# Patient Record
Sex: Male | Born: 1995 | Race: White | Hispanic: No | Marital: Single | Smoking: Never smoker
Health system: Southern US, Community
[De-identification: ages and names within clinical notes are randomized; demographics above are authoritative.]

## PROBLEM LIST (undated history)

## (undated) HISTORY — PX: NO PAST SURGERIES: SHX2092

---

## 2016-04-07 ENCOUNTER — Ambulatory Visit
Admission: RE | Admit: 2016-04-07 | Discharge: 2016-04-07 | Disposition: A | Discharge status: Home / Self Care | Payer: Self-pay | Source: Ambulatory Visit | Attending: Occupational Medicine | Admitting: Occupational Medicine

## 2016-04-07 ENCOUNTER — Other Ambulatory Visit: Payer: Self-pay | Admitting: Occupational Medicine

## 2016-04-07 DIAGNOSIS — Z021 Encounter for pre-employment examination: Secondary | ICD-10-CM

## 2018-03-27 ENCOUNTER — Other Ambulatory Visit: Payer: Self-pay | Admitting: Orthopaedic Surgery

## 2018-03-27 ENCOUNTER — Other Ambulatory Visit: Payer: Self-pay

## 2018-03-27 ENCOUNTER — Encounter (HOSPITAL_COMMUNITY): Payer: Self-pay | Admitting: *Deleted

## 2018-03-27 NOTE — Progress Notes (Signed)
Spoke with pt for pre-op call. Pt denies cardiac history, HTN or Diabetes. 

## 2018-03-30 ENCOUNTER — Encounter (HOSPITAL_COMMUNITY)
Admission: RE | Disposition: A | Discharge status: Home / Self Care | Payer: Self-pay | Source: Home / Self Care | Attending: Orthopaedic Surgery

## 2018-03-30 ENCOUNTER — Ambulatory Visit (HOSPITAL_COMMUNITY): Payer: 59 | Admitting: Certified Registered"

## 2018-03-30 ENCOUNTER — Other Ambulatory Visit: Payer: Self-pay

## 2018-03-30 ENCOUNTER — Encounter (HOSPITAL_COMMUNITY): Payer: Self-pay | Admitting: *Deleted

## 2018-03-30 ENCOUNTER — Ambulatory Visit (HOSPITAL_COMMUNITY): Payer: 59

## 2018-03-30 ENCOUNTER — Ambulatory Visit (HOSPITAL_COMMUNITY)
Admission: RE | Admit: 2018-03-30 | Discharge: 2018-03-30 | Disposition: A | Discharge status: Home / Self Care | Payer: 59 | Attending: Orthopaedic Surgery | Admitting: Orthopaedic Surgery

## 2018-03-30 DIAGNOSIS — S92351A Displaced fracture of fifth metatarsal bone, right foot, initial encounter for closed fracture: Secondary | ICD-10-CM | POA: Insufficient documentation

## 2018-03-30 DIAGNOSIS — X501XXA Overexertion from prolonged static or awkward postures, initial encounter: Secondary | ICD-10-CM | POA: Diagnosis not present

## 2018-03-30 DIAGNOSIS — T148XXA Other injury of unspecified body region, initial encounter: Secondary | ICD-10-CM

## 2018-03-30 HISTORY — PX: REPAIR EXTENSOR TENDON WITH METATARSAL OSTEOTOMY AND OPEN REDUCTION IN: SHX5698

## 2018-03-30 LAB — HEMOGLOBIN: Hemoglobin: 14.9 g/dL (ref 13.0–17.0)

## 2018-03-30 SURGERY — REPAIR EXTENSOR TENDON WITH METATARSAL OSTEOTOMY AND OPEN REDUCTION INTERNAL FIXATION (ORIF) METATARSAL
Anesthesia: General | Site: Foot | Laterality: Right

## 2018-03-30 MED ORDER — MEPERIDINE HCL 50 MG/ML IJ SOLN: 6.2500 mg | INTRAMUSCULAR | Status: DC | PRN

## 2018-03-30 MED ORDER — LIDOCAINE 2% (20 MG/ML) 5 ML SYRINGE
INTRAMUSCULAR | Status: DC | PRN
  Administered 2018-03-30: 40 mg via INTRAVENOUS

## 2018-03-30 MED ORDER — OXYCODONE HCL 5 MG PO TABS: 5.0000 mg | ORAL_TABLET | ORAL | 0 refills | Status: DC | PRN

## 2018-03-30 MED ORDER — EPHEDRINE 5 MG/ML INJ
INTRAVENOUS | Status: AC
  Filled 2018-03-30: qty 10

## 2018-03-30 MED ORDER — SUGAMMADEX SODIUM 200 MG/2ML IV SOLN
INTRAVENOUS | Status: DC | PRN
  Administered 2018-03-30: 200 mg via INTRAVENOUS

## 2018-03-30 MED ORDER — DEXAMETHASONE SODIUM PHOSPHATE 10 MG/ML IJ SOLN
INTRAMUSCULAR | Status: DC | PRN
  Administered 2018-03-30: 10 mg via INTRAVENOUS

## 2018-03-30 MED ORDER — ROCURONIUM BROMIDE 50 MG/5ML IV SOSY
PREFILLED_SYRINGE | INTRAVENOUS | Status: AC
  Filled 2018-03-30: qty 5

## 2018-03-30 MED ORDER — SCOPOLAMINE 1 MG/3DAYS TD PT72
MEDICATED_PATCH | TRANSDERMAL | Status: AC
  Administered 2018-03-30: 1.5 mg
  Filled 2018-03-30: qty 1

## 2018-03-30 MED ORDER — LIDOCAINE 2% (20 MG/ML) 5 ML SYRINGE
INTRAMUSCULAR | Status: AC
  Filled 2018-03-30: qty 5

## 2018-03-30 MED ORDER — ONDANSETRON HCL 4 MG/2ML IJ SOLN
INTRAMUSCULAR | Status: AC
  Filled 2018-03-30: qty 2

## 2018-03-30 MED ORDER — EPHEDRINE SULFATE 50 MG/ML IJ SOLN
INTRAMUSCULAR | Status: DC | PRN
  Administered 2018-03-30 (×2): 5 mg via INTRAVENOUS
  Administered 2018-03-30: 10 mg via INTRAVENOUS

## 2018-03-30 MED ORDER — OXYCODONE HCL 5 MG PO TABS: 5.0000 mg | ORAL_TABLET | ORAL | 0 refills | Status: AC | PRN

## 2018-03-30 MED ORDER — HYDROMORPHONE HCL 1 MG/ML IJ SOLN: 0.2500 mg | INTRAMUSCULAR | Status: DC | PRN

## 2018-03-30 MED ORDER — CEFAZOLIN SODIUM-DEXTROSE 2-4 GM/100ML-% IV SOLN
2.0000 g | INTRAVENOUS | Status: AC
  Administered 2018-03-30: 2 g via INTRAVENOUS

## 2018-03-30 MED ORDER — 0.9 % SODIUM CHLORIDE (POUR BTL) OPTIME
TOPICAL | Status: DC | PRN
  Administered 2018-03-30: 1000 mL

## 2018-03-30 MED ORDER — GLYCOPYRROLATE PF 0.2 MG/ML IJ SOSY
PREFILLED_SYRINGE | INTRAMUSCULAR | Status: AC
  Filled 2018-03-30: qty 1

## 2018-03-30 MED ORDER — CEFAZOLIN SODIUM 1 G IJ SOLR
INTRAMUSCULAR | Status: AC
  Filled 2018-03-30: qty 20

## 2018-03-30 MED ORDER — ONDANSETRON HCL 4 MG/2ML IJ SOLN
INTRAMUSCULAR | Status: DC | PRN
  Administered 2018-03-30: 4 mg via INTRAVENOUS

## 2018-03-30 MED ORDER — DEXAMETHASONE SODIUM PHOSPHATE 10 MG/ML IJ SOLN
INTRAMUSCULAR | Status: AC
  Filled 2018-03-30: qty 1

## 2018-03-30 MED ORDER — LACTATED RINGERS IV SOLN
INTRAVENOUS | Status: DC
  Administered 2018-03-30: 09:00:00 via INTRAVENOUS

## 2018-03-30 MED ORDER — PROMETHAZINE HCL 25 MG/ML IJ SOLN: 6.2500 mg | INTRAMUSCULAR | Status: DC | PRN

## 2018-03-30 MED ORDER — PROPOFOL 10 MG/ML IV BOLUS
INTRAVENOUS | Status: DC | PRN
  Administered 2018-03-30: 150 mg via INTRAVENOUS

## 2018-03-30 MED ORDER — ROCURONIUM BROMIDE 10 MG/ML (PF) SYRINGE
PREFILLED_SYRINGE | INTRAVENOUS | Status: DC | PRN
  Administered 2018-03-30: 50 mg via INTRAVENOUS

## 2018-03-30 MED ORDER — MIDAZOLAM HCL 5 MG/5ML IJ SOLN
INTRAMUSCULAR | Status: DC | PRN
  Administered 2018-03-30: 2 mg via INTRAVENOUS

## 2018-03-30 MED ORDER — METOCLOPRAMIDE HCL 5 MG/ML IJ SOLN
INTRAMUSCULAR | Status: AC
  Filled 2018-03-30: qty 2

## 2018-03-30 MED ORDER — MIDAZOLAM HCL 2 MG/2ML IJ SOLN
INTRAMUSCULAR | Status: AC
  Filled 2018-03-30: qty 2

## 2018-03-30 MED ORDER — FENTANYL CITRATE (PF) 250 MCG/5ML IJ SOLN
INTRAMUSCULAR | Status: AC
  Filled 2018-03-30: qty 5

## 2018-03-30 MED ORDER — MIDAZOLAM HCL 2 MG/2ML IJ SOLN: 0.5000 mg | Freq: Once | INTRAMUSCULAR | Status: DC | PRN

## 2018-03-30 MED ORDER — FENTANYL CITRATE (PF) 250 MCG/5ML IJ SOLN
INTRAMUSCULAR | Status: DC | PRN
  Administered 2018-03-30 (×2): 50 ug via INTRAVENOUS
  Administered 2018-03-30: 100 ug via INTRAVENOUS

## 2018-03-30 MED ORDER — PROPOFOL 10 MG/ML IV BOLUS
INTRAVENOUS | Status: AC
  Filled 2018-03-30: qty 20

## 2018-03-30 MED ORDER — POVIDONE-IODINE 10 % EX SWAB
2.0000 "application " | Freq: Once | CUTANEOUS | Status: AC
  Administered 2018-03-30: 2 via TOPICAL

## 2018-03-30 SURGICAL SUPPLY — 50 items
BIT DRILL CANN 3.5 (BIT) ×1
BIT DRILL SRG 3.5XCAN FFTH MTR (BIT) ×1 IMPLANT
BIT DRL SRG 3.5XCANN FIFTH MTR (BIT) ×1
BLADE SURG 15 STRL LF DISP TIS (BLADE) ×1 IMPLANT
BLADE SURG 15 STRL SS (BLADE) ×1
BNDG ELASTIC 6X10 VLCR STRL LF (GAUZE/BANDAGES/DRESSINGS) ×2 IMPLANT
CANISTER SUCT 3000ML PPV (MISCELLANEOUS) ×2 IMPLANT
CHLORAPREP W/TINT 26ML (MISCELLANEOUS) ×2 IMPLANT
COVER SURGICAL LIGHT HANDLE (MISCELLANEOUS) ×2 IMPLANT
COVER WAND RF STERILE (DRAPES) IMPLANT
CUFF TOURNIQUET SINGLE 34IN LL (TOURNIQUET CUFF) ×2 IMPLANT
DRAPE C-ARM 42X72 X-RAY (DRAPES) ×2 IMPLANT
DRAPE C-ARMOR (DRAPES) ×2 IMPLANT
DRAPE U-SHAPE 47X51 STRL (DRAPES) ×2 IMPLANT
DRSG PAD ABDOMINAL 8X10 ST (GAUZE/BANDAGES/DRESSINGS) ×4 IMPLANT
ELECT REM PT RETURN 9FT ADLT (ELECTROSURGICAL) ×2
ELECTRODE REM PT RTRN 9FT ADLT (ELECTROSURGICAL) ×1 IMPLANT
GAUZE SPONGE 4X4 12PLY STRL (GAUZE/BANDAGES/DRESSINGS) IMPLANT
GAUZE SPONGE 4X4 12PLY STRL LF (GAUZE/BANDAGES/DRESSINGS) ×2 IMPLANT
GAUZE XEROFORM 1X8 LF (GAUZE/BANDAGES/DRESSINGS) ×2 IMPLANT
GLOVE BIO SURGEON STRL SZ7.5 (GLOVE) ×2 IMPLANT
GLOVE BIOGEL M STRL SZ7.5 (GLOVE) ×2 IMPLANT
GLOVE BIOGEL PI IND STRL 8 (GLOVE) ×1 IMPLANT
GLOVE BIOGEL PI INDICATOR 8 (GLOVE) ×1
GLOVE SURG SS PI 8.0 STRL IVOR (GLOVE) ×2 IMPLANT
GOWN STRL REUS W/ TWL LRG LVL3 (GOWN DISPOSABLE) ×1 IMPLANT
GOWN STRL REUS W/ TWL XL LVL3 (GOWN DISPOSABLE) ×2 IMPLANT
GOWN STRL REUS W/TWL LRG LVL3 (GOWN DISPOSABLE) ×1
GOWN STRL REUS W/TWL XL LVL3 (GOWN DISPOSABLE) ×2
GUIDEWIRE .07X8 (WIRE) ×2 IMPLANT
KIT BASIN OR (CUSTOM PROCEDURE TRAY) ×2 IMPLANT
KIT TURNOVER KIT B (KITS) ×2 IMPLANT
NS IRRIG 1000ML POUR BTL (IV SOLUTION) ×2 IMPLANT
PACK ORTHO EXTREMITY (CUSTOM PROCEDURE TRAY) ×2 IMPLANT
PAD ABD 8X10 STRL (GAUZE/BANDAGES/DRESSINGS) ×2 IMPLANT
PAD ARMBOARD 7.5X6 YLW CONV (MISCELLANEOUS) ×4 IMPLANT
PAD CAST 4YDX4 CTTN HI CHSV (CAST SUPPLIES) ×1 IMPLANT
PADDING CAST COTTON 4X4 STRL (CAST SUPPLIES) ×1
SCREW 5.5X50MM (Screw) ×2 IMPLANT
SPLINT PLASTER CAST XFAST 5X30 (CAST SUPPLIES) ×1 IMPLANT
SPLINT PLASTER XFAST SET 5X30 (CAST SUPPLIES) ×1
SPONGE LAP 18X18 X RAY DECT (DISPOSABLE) ×2 IMPLANT
SUCTION FRAZIER HANDLE 10FR (MISCELLANEOUS) ×1
SUCTION TUBE FRAZIER 10FR DISP (MISCELLANEOUS) ×1 IMPLANT
SUT ETHILON 3 0 PS 1 (SUTURE) ×2 IMPLANT
SUT MNCRL AB 3-0 PS2 18 (SUTURE) ×2 IMPLANT
SUT VIC AB 2-0 CT1 27 (SUTURE) ×1
SUT VIC AB 2-0 CT1 TAPERPNT 27 (SUTURE) ×1 IMPLANT
TOWEL OR 17X26 10 PK STRL BLUE (TOWEL DISPOSABLE) ×2 IMPLANT
TUBE CONNECTING 12X1/4 (SUCTIONS) ×2 IMPLANT

## 2018-03-30 NOTE — H&P (Signed)
Mark Richmond is an 22 y.o. male.   Chief Complaint: Left foot Jones fracture HPI: Patient was running and twisted his right foot.  He had immediate pain and swelling in the lateral border of the foot.  He was seen in my office and diagnosed with 5th metatarsal base fracture in zone 2.  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  He was indicated for open treatment of this fracture.  We discussed conservative versus nonsurgical treatment he opted for surgical treatment.  Patient seen today for operative treatment.  He continues have pain in the right foot.  He is been maintaining nonweightbearing.  Has been in a boot.  Denies any other joint or extremity pain today.  History reviewed. No pertinent past medical history.  Past Surgical History:  Procedure Laterality Date  . NO PAST SURGERIES      History reviewed. No pertinent family history. Social History:  reports that he has never smoked. He has never used smokeless tobacco. He reports that he does not drink alcohol or use drugs.  Allergies: No Known Allergies  No medications prior to admission.    Results for orders placed or performed during the hospital encounter of 03/30/18 (from the past 48 hour(s))  Hemoglobin     Status: None   Collection Time: 03/30/18  8:39 AM  Result Value Ref Range   Hemoglobin 14.9 13.0 - 17.0 g/dL    Comment: Performed at [facility], [address] [place], [place] [address]   No results found.  Review of Systems  Constitutional: Negative.   HENT: Negative.   Respiratory: Negative.   Cardiovascular: Negative.   Gastrointestinal: Negative.   Musculoskeletal:       Right foot pain.    Skin: Negative.   Neurological: Negative.   Psychiatric/Behavioral: Negative.     Blood pressure (!) 164/79, pulse (!) 57, temperature 98 F (36.7 C), temperature source Oral, resp. rate 20, height 6\' 1"  (1.854 m), weight 95.3 kg, SpO2 100 %. Physical Exam  Constitutional: He appears  well-developed.  HENT:  Head: Normocephalic.  Eyes: Conjunctivae are normal.  Cardiovascular: Normal rate.  Respiratory: Effort normal.  GI: Soft.  Musculoskeletal:     Comments: TTP right foot. Palp DP pulse.  Wiggles toes.  Neurological: He is alert.  Skin: Skin is warm.  Psychiatric: He has a normal mood and affect.     Assessment/Plan We will proceed with planned operative intervention.  This will be open treatment of his left fifth metatarsal base.  The risk, benefits and alternatives of surgery were discussed with him in detail in the clinic and are well documented.  The risks included but were not limited to wound healing complications, infection, nonunion, malunion, need for second surgery, damage surrounding structures, perioperative and anesthetic risks that include death.  After weighing these risks and benefits he opted to proceed with surgery.  He understand that he will be nonweightbearing postoperatively.  Terance Harthristopher R Jamillah Camilo, MD 03/30/2018, 10:31 AM

## 2018-03-30 NOTE — Anesthesia Postprocedure Evaluation (Signed)
Anesthesia Post Note  Patient: Mark Richmond  Procedure(s) Performed: OPEN REDUCTION INTERNAL FIXATION (ORIF) METATARSAL (Right Foot)     Patient location during evaluation: PACU Anesthesia Type: General Level of consciousness: awake and alert, patient cooperative and oriented Pain management: pain level controlled Vital Signs Assessment: post-procedure vital signs reviewed and stable Respiratory status: spontaneous breathing, nonlabored ventilation and respiratory function stable Cardiovascular status: blood pressure returned to baseline and stable Postop Assessment: no apparent nausea or vomiting Anesthetic complications: no    Last Vitals:  Vitals:   03/30/18 1220 03/30/18 1235  BP: 132/65 136/63  Pulse: 64 67  Resp: 13 15  Temp:  36.7 C  SpO2: 98% 98%    Last Pain:  Vitals:   03/30/18 1235  TempSrc:   PainSc: 0-No pain                 Mesha Schamberger,E. Deven Audi

## 2018-03-30 NOTE — Transfer of Care (Signed)
Immediate Anesthesia Transfer of Care Note  Patient: Currie ParisGregory W Burditt  Procedure(s) Performed: OPEN REDUCTION INTERNAL FIXATION (ORIF) METATARSAL (Right Foot)  Patient Location: PACU  Anesthesia Type:General  Level of Consciousness: awake, alert , oriented and patient cooperative  Airway & Oxygen Therapy: Patient Spontanous Breathing and Patient connected to nasal cannula oxygen  Post-op Assessment: Report given to RN and Post -op Vital signs reviewed and stable  Post vital signs: Reviewed and stable  Last Vitals:  Vitals Value Taken Time  BP 142/65 03/30/2018 12:08 PM  Temp 36.7 C 03/30/2018 12:08 PM  Pulse 91 03/30/2018 12:15 PM  Resp 18 03/30/2018 12:15 PM  SpO2 97 % 03/30/2018 12:15 PM  Vitals shown include unvalidated device data.  Last Pain:  Vitals:   03/30/18 1208  TempSrc:   PainSc: (P) 0-No pain         Complications: No apparent anesthesia complications

## 2018-03-30 NOTE — Discharge Instructions (Signed)
DR. Vickey Boak FOOT & ANKLE SURGERY POST-OP INSTRUCTIONS ° ° °Pain Management °1. The numbing medicine and your leg will last around 4 hours, take a dose of your pain medicine as soon as you feel it wearing off to avoid rebound pain. °2. Keep your foot elevated above heart level.  Make sure that your heel hangs free ('floats'). °3. Take all prescribed medication as directed. °4. If taking narcotic pain medication you may want to use an over-the-counter stool softener to avoid constipation. °5. You may take over-the-counter NSAIDs (ibuprofen, naproxen, etc.) as well as over-the-counter acetaminophen as directed on the packaging as a supplement for your pain and may also use it to wean away from the prescription medication. ° °Activity °? Non-weightbearing °? Postoperatively, you will be placed into a splint which stays on for 2 weeks and then will be changed at your first postop visit. ° °First Postoperative Visit °1. Your first postop visit will be at least 2 weeks after surgery.  This should be scheduled when you schedule surgery. °2. If you do not have a postoperative visit scheduled please call [phone] to schedule an appointment. °3. At the appointment your incision will be evaluated for suture removal, x-rays will be obtained if necessary. ° °General Instructions °1. Swelling is very common after foot and ankle surgery.  It often takes 3 months for the foot and ankle to begin to feel comfortable.  Some amount of swelling will persist for 6-12 months. °2. DO NOT change the dressing.  If there is a problem with the dressing (too tight, loose, gets wet, etc.) please contact Dr. Camille Dragan's office. °3. DO NOT get the dressing wet.  For showers you can use an over-the-counter cast cover or wrap a washcloth around the top of your dressing and then cover it with a plastic bag and tape it to your leg. °4. DO NOT soak the incision (no tubs, pools, bath, etc.) until you have approval from Dr. Audri Kozub. ° °Contact Dr. Adairs  office or go to Emergency Room if: °1. Temperature above 101° F. °2. Increasing pain that is unresponsive to pain medication or elevation °3. Excessive redness or swelling in your foot °4. Dressing problems - excessive bloody drainage, looseness or tightness, or if dressing gets wet °5. Develop pain, swelling, warmth, or discoloration of your calf ° °

## 2018-03-30 NOTE — Progress Notes (Signed)
Orthopedic Tech Progress Note Patient Details:  Mark Richmond 1995-08-08 [id number]  Ortho Devices Type of Ortho Device: Crutches Ortho Device/Splint Interventions: Ordered, Application, Adjustment   Post Interventions Patient Tolerated: Well Instructions Provided: Care of device, Adjustment of device   [name], [name] 03/30/2018, 1:42 PM

## 2018-03-30 NOTE — Op Note (Signed)
Mark ParisGregory W Habel male 22 y.o. 03/30/2018  PreOperative Diagnosis: Right zone 2 5th metatarsal base fracture  PostOperative Diagnosis: Same   Procedure(s) and Anesthesia Type:    * OPEN REDUCTION INTERNAL FIXATION (ORIF) METATARSAL - Choice  Surgeon: Terance Harthristopher R Abagale Boulos   Assistants: Jill AlexandersJustin, RNFA  Anesthesia: General endotracheal anesthesia  Findings: Right zone 2 5th metatarsal base fracture  Implants: Arthrex 5.5 mm partially-threaded screw 50 mm long  Indications:22 y.o. male was running and twisted his right foot.  He had immediate pain and swelling.  He was seen in my office and diagnosed with a zone 2/5 metatarsal base fracture.  He did not have any antecedent pain.  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  The discussion was had of operative versus nonoperative treatment.  The risk, benefits and alternatives of the surgery were discussed with him.  The risks included but were not limited to wound healing complications, infection, nonunion, malunion, need for second surgery, continued pain, damage to surrounding structures.  The perioperative and anesthetic risks were also included briefly and included death.  After weighing these risks and benefits he opted to proceed with surgery.  Procedure Detail: Patient was identified in the preoperative holding area.  The right lower extremity was marked by myself.  The consent was was on the patient.  He was taken to the operative suite and general anesthesia was induced without difficulty.  He was placed lateral on a beanbag.  All bony prominences well-padded.  A platform was created with sheets and blankets for the right leg.  The right lower extremity was prepped and draped in the usual sterile fashion.  Preoperative antibiotics were given.  A timeout was performed.  I began by marking out the anatomy of the base of the fifth  metatarsal.  Then a small 1.5 mm incision was made proximal and dorsal to the tip of the fifth metatarsal.  This was taken sharply down through skin and subcutaneous tissue.  Then blunt dissection was used to advanced through the deep tissues to the base of the fifth metatarsal.  Then spreading the deep tissues using a hemostat a path was created for soft tissue protection and screw insertion.  Then a K wire for the cannulated drill was used and inserted into the base of the fifth metatarsal using a tissue protector.  This was then confirmed on fluoroscopy in the AP, lateral and oblique views.  Appropriate trajectory of the pin and pin depth were confirmed.  Then using the cannulated drill with a soft tissue protector the proximal portion of the fracture was entered into the canal and taken across the fracture site.  Then a 5.5 mm tap was used to tap the entire length of the screw.  Then the K wire was removed and a 5.5 mm partially-threaded noncannulated screw was placed.  This was 50 mm long.  All threads across the fracture site and there was good compression.  Compression was confirmed on fluoroscopic images.  The wound was then irrigated and the subcutaneous tissue was closed with 3-0 Monocryl.  The skin was closed in interrupted fashion with 2-0 nylon.  Xeroform, 4 x 4's and a short leg splint was placed.  The counts were correct at the end of the case.  There were no complications.  Post Op Instructions: Nonweightbearing right lower extremity Keep splint dry Call the office with concerns Keep limb elevated  No need for DVT prophylaxis Patient will follow-up in 2 weeks for splint removal, nonweightbearing x-rays and suture removal.  Tourniquette Time:none  Estimated Blood Loss:  Minimal         Drains: none  Blood Given: none         Specimens: none       Complications:  * No complications entered in OR log *         Disposition: PACU - hemodynamically stable.         Condition:  stable

## 2018-03-30 NOTE — Anesthesia Preprocedure Evaluation (Addendum)
Anesthesia Evaluation  Patient identified by MRN, date of birth, ID band Patient awake    Reviewed: Allergy & Precautions, NPO status , Patient's Chart, lab work & pertinent test results  History of Anesthesia Complications Negative for: history of anesthetic complications  Airway Mallampati: II  TM Distance: >3 FB Neck ROM: Full    Dental  (+) Dental Advisory Given   Pulmonary neg pulmonary ROS,    breath sounds clear to auscultation       Cardiovascular negative cardio ROS   Rhythm:Regular Rate:Normal     Neuro/Psych negative neurological ROS     GI/Hepatic negative GI ROS, Neg liver ROS,   Endo/Other  negative endocrine ROS  Renal/GU negative Renal ROS     Musculoskeletal   Abdominal   Peds  Hematology negative hematology ROS (+)   Anesthesia Other Findings   Reproductive/Obstetrics                             Anesthesia Physical Anesthesia Plan  ASA: I  Anesthesia Plan: General   Post-op Pain Management:    Induction: Intravenous  PONV Risk Score and Plan: 3 and Ondansetron, Dexamethasone and Scopolamine patch - Pre-op  Airway Management Planned: LMA  Additional Equipment:   Intra-op Plan:   Post-operative Plan:   Informed Consent: I have reviewed the patients History and Physical, chart, labs and discussed the procedure including the risks, benefits and alternatives for the proposed anesthesia with the patient or authorized representative who has indicated his/her understanding and acceptance.   Dental advisory given  Plan Discussed with: CRNA and Surgeon  Anesthesia Plan Comments: (Plan routine monitors, GA- LMA OK)       Anesthesia Quick Evaluation

## 2018-03-30 NOTE — Anesthesia Procedure Notes (Signed)
Procedure Name: Intubation Date/Time: 03/30/2018 11:07 AM Performed by: Cleda Daub, CRNA Pre-anesthesia Checklist: Patient identified, Emergency Drugs available, Suction available and Patient being monitored Patient Re-evaluated:Patient Re-evaluated prior to induction Oxygen Delivery Method: Circle system utilized Preoxygenation: Pre-oxygenation with 100% oxygen Induction Type: IV induction Ventilation: Mask ventilation without difficulty and Mask ventilation throughout procedure Laryngoscope Size: Mac and 3 Grade View: Grade I Tube type: Oral Tube size: 7.5 mm Number of attempts: 1 Airway Equipment and Method: Stylet Placement Confirmation: ETT inserted through vocal cords under direct vision,  positive ETCO2 and breath sounds checked- equal and bilateral Secured at: 23 cm Tube secured with: Tape Dental Injury: Teeth and Oropharynx as per pre-operative assessment

## 2018-03-31 ENCOUNTER — Encounter (HOSPITAL_COMMUNITY): Payer: Self-pay | Admitting: Orthopaedic Surgery

## 2018-06-09 ENCOUNTER — Emergency Department (HOSPITAL_COMMUNITY): Payer: No Typology Code available for payment source

## 2018-06-09 ENCOUNTER — Encounter (HOSPITAL_COMMUNITY): Payer: Self-pay | Admitting: Emergency Medicine

## 2018-06-09 ENCOUNTER — Emergency Department (HOSPITAL_COMMUNITY)
Admission: EM | Admit: 2018-06-09 | Discharge: 2018-06-09 | Disposition: A | Discharge status: Home / Self Care | Payer: No Typology Code available for payment source | Attending: Emergency Medicine | Admitting: Emergency Medicine

## 2018-06-09 DIAGNOSIS — M79671 Pain in right foot: Secondary | ICD-10-CM | POA: Insufficient documentation

## 2018-06-09 NOTE — ED Notes (Signed)
Patient transported to X-ray 

## 2018-06-09 NOTE — Discharge Instructions (Addendum)
Use ice 3-4 times daily alternating 20 minutes on, 20 minutes off.  You can alternate ibuprofen and Tylenol as needed for pain.  Keep your leg elevated whenever you are not walking on it.  I recommend avoiding running and progress back into full duty as tolerated.  I recommend calling Dr. Donnie Mesa office to make them aware and seek his recommendations.  Please return to emergency department if you develop any new or worsening symptoms.

## 2018-06-09 NOTE — ED Provider Notes (Signed)
[facility] EMERGENCY DEPARTMENT Provider Note   CSN: [id number] Arrival date & time: 06/09/18  0348    History   Chief Complaint Chief Complaint  Patient presents with  . Foot Pain    HPI Mark Richmond Mark Richmond is a 23 y.o. male with recent surgery on fifth metatarsal the right foot who presents with pain to his right foot.  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  He is able to bear weight.  He denies any numbness or tingling.  No medications taken prior to arrival.  His surgery was completed by Dr. Susa Simmonds in December 2019.     HPI  History reviewed. No pertinent past medical history.  There are no active problems to display for this patient.   Past Surgical History:  Procedure Laterality Date  . NO PAST SURGERIES    . REPAIR EXTENSOR TENDON WITH METATARSAL OSTEOTOMY AND OPEN REDUCTION IN Right 03/30/2018   Procedure: OPEN REDUCTION INTERNAL FIXATION (ORIF) METATARSAL;  Surgeon: Terance Hart, MD;  Location: [facility] OR;  Service: Orthopedics;  Laterality: Right;        Home Medications    Prior to Admission medications   Not on File    Family History History reviewed. No pertinent family history.  Social History Social History   Tobacco Use  . Smoking status: Never Smoker  . Smokeless tobacco: Never Used  Substance Use Topics  . Alcohol use: Never    Frequency: Never  . Drug use: Never     Allergies   Patient has no known allergies.   Review of Systems Review of Systems  Musculoskeletal: Positive for arthralgias.  Neurological: Negative for numbness.     Physical Exam Updated Vital Signs BP 129/81 (BP Location: Right Arm)   Pulse 87   Temp 98 F (36.7 C) (Oral)   Resp 16   Ht 6\' 2"  (1.88 m)   Wt 95.3 kg   SpO2 95%   BMI 26.96 kg/m   Physical Exam Vitals signs and nursing note  reviewed.  Constitutional:      General: He is not in acute distress.    Appearance: He is well-developed. He is not diaphoretic.  HENT:     Head: Normocephalic and atraumatic.     Mouth/Throat:     Pharynx: No oropharyngeal exudate.  Eyes:     General: No scleral icterus.       Right eye: No discharge.        Left eye: No discharge.     Conjunctiva/sclera: Conjunctivae normal.     Pupils: Pupils are equal, round, and reactive to light.  Neck:     Musculoskeletal: Normal range of motion and neck supple.     Thyroid: No thyromegaly.  Cardiovascular:     Rate and Rhythm: Normal rate.  Pulmonary:     Effort: Pulmonary effort is normal.  Musculoskeletal:     Comments: Mild edema noted over the right fifth metatarsal, no tenderness about the foot, no ecchymosis  Lymphadenopathy:     Cervical: No cervical adenopathy.  Skin:    General: Skin is warm and dry.     Coloration: Skin is not pale.     Findings: No rash.  Neurological:     Mental Status: He is alert.     Coordination: Coordination normal.  ED Treatments / Results  Labs (all labs ordered are listed, but only abnormal results are displayed) Labs Reviewed - No data to display  EKG None  Radiology Dg Foot Complete Right  Result Date: 06/09/2018 CLINICAL DATA:  RIGHT foot pain. Fifth metatarsal surgery December 2019. EXAM: RIGHT FOOT COMPLETE - 3+ VIEW COMPARISON:  None. FINDINGS: No acute fracture deformity or dislocation. Partially threaded screw transfixing nondisplaced fifth metatarsal fracture, fracture line remains visible. No periprosthetic lucency. Mild osteopenia. No destructive bony lesions. Soft tissue planes are non suspicious. IMPRESSION: 1. No acute fracture deformity or dislocation. 2. Status post fifth metatarsal ORIF, fracture line remains visible. Electronically Signed   By: Awilda Metro M.D.   On: 06/09/2018 05:11    Procedures Procedures (including critical care time)  Medications Ordered  in ED Medications - No data to display   Initial Impression / Assessment and Plan / ED Course  I have reviewed the triage vital signs and the nursing notes.  Pertinent labs & imaging results that were available during my care of the patient were reviewed by me and considered in my medical decision making (see chart for details).        Patient presenting with right foot pain after running.  He was just cleared to go back to work.  Right foot x-ray shows no acute fracture deformity or dislocation and status post fifth metatarsal ORIF fracture line remaining visible.  Patient is able to walk without significant pain, however had a lot of pain after running.  Patient will be given light duty for now and advised to call his surgeon to make him aware.  Ibuprofen, Tylenol advised for pain.  Ice and elevation discussed.  Return precautions discussed.  Patient understands and agrees with plan.  Patient vital stable throughout ED course and discharged in satisfactory condition.  Final Clinical Impressions(s) / ED Diagnoses   Final diagnoses:  Foot pain, right    ED Discharge Orders    None       Emi Holes, Cordelia Poche 06/09/18 Standley Brooking, MD 06/09/18 [phone]

## 2018-06-09 NOTE — ED Triage Notes (Signed)
  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  Patient was just cleared to return to work and felt pain after running.  Pain is only there when bearing weight.  No numbness or tingling.  Patient is A&O x4.

## 2019-12-15 IMAGING — RF DG C-ARM 61-120 MIN
1 series · 3 of 3 positions shown · non-contrast
Comparison: None.

CLINICAL DATA: 22-year-old male undergoing ORIF of right metatarsal
fracture.

EXAM:
RIGHT FOOT COMPLETE - 3+ VIEW; DG C-ARM 61-120 MIN

[Series 1: run · 3 of 3 slices shown]
[im 1/3]
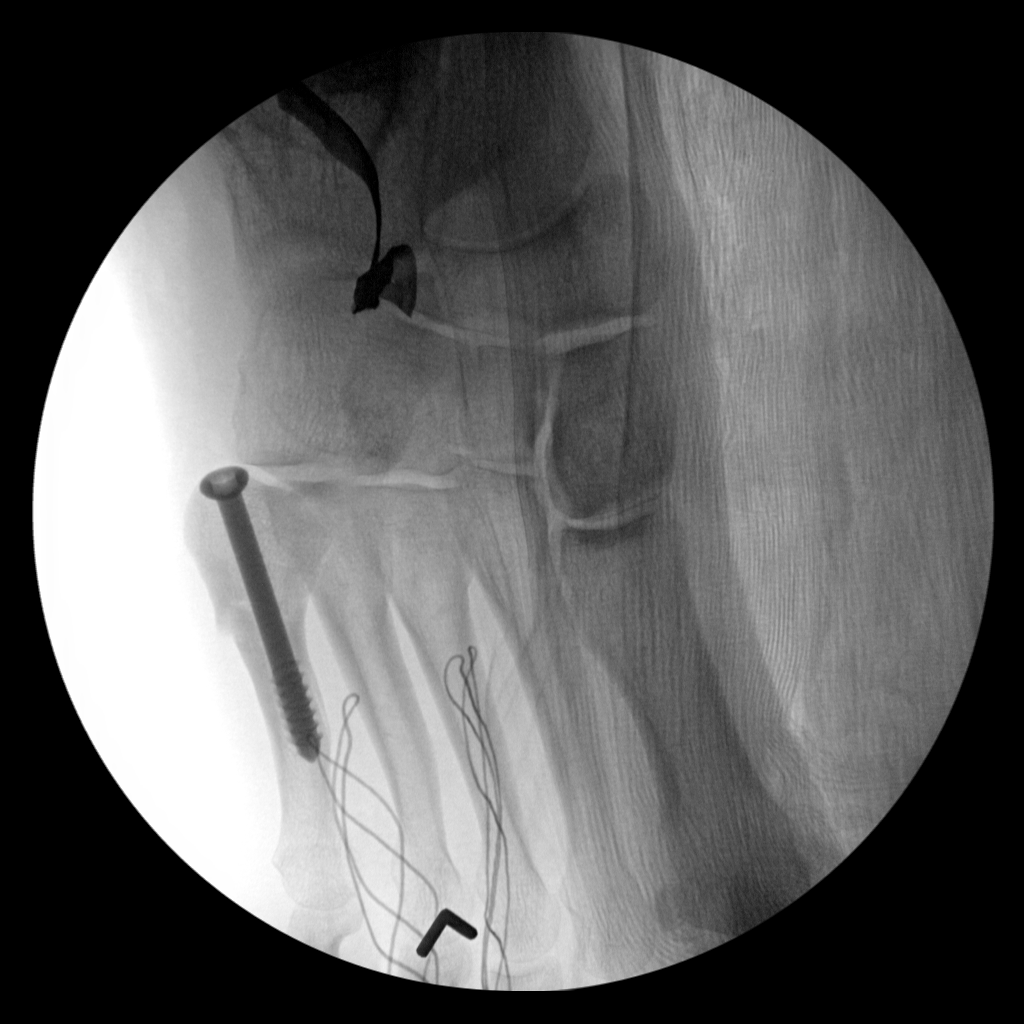
[im 2/3]
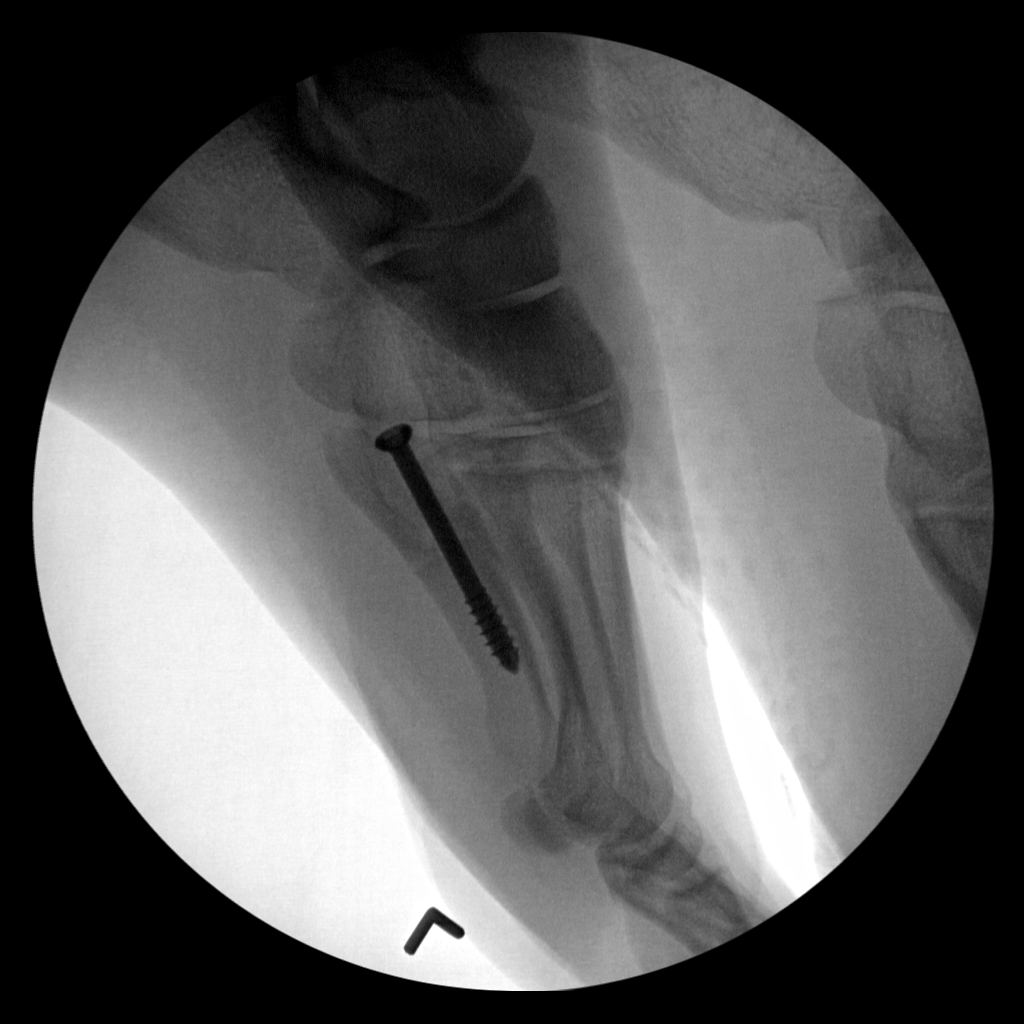
[im 3/3]
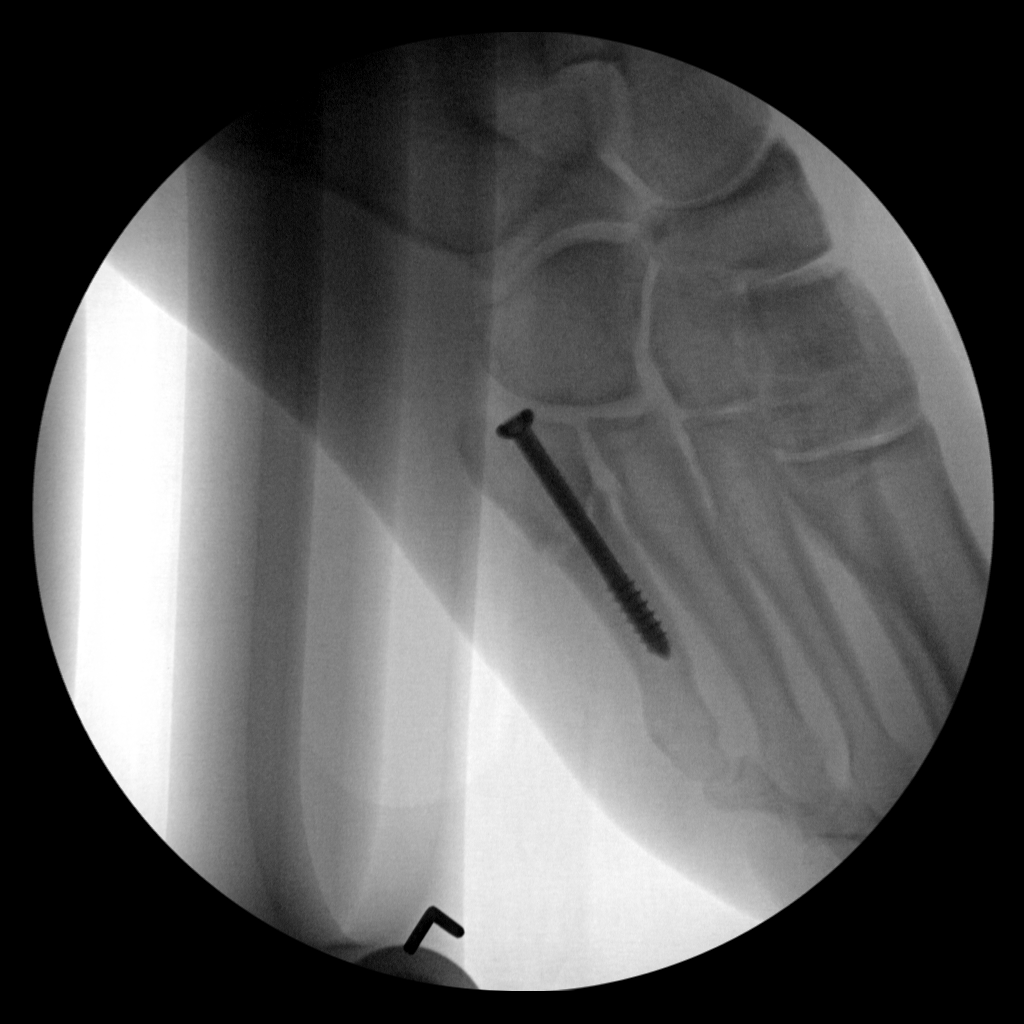

[3 of 3 positions shown; findings below may reference images not displayed]

FINDINGS: Three intraoperative fluoroscopic spot views of the right foot. A
cortical screw traverses the proximal and mid right 5th metatarsal
where an oblique fracture is noted at the proximal metadiaphysis.
Fracture alignment is near anatomic. No adverse features
IMPRESSION: Right 5th metatarsal ORIF with no adverse features.
